# Patient Record
Sex: Female | Born: 1990 | Race: Black or African American | Hispanic: No | Marital: Single | Smoking: Never smoker
Health system: Southern US, Community
[De-identification: ages and names within clinical notes are randomized; demographics above are authoritative.]

## PROBLEM LIST (undated history)

## (undated) DIAGNOSIS — T7840XA Allergy, unspecified, initial encounter: Secondary | ICD-10-CM

## (undated) DIAGNOSIS — A539 Syphilis, unspecified: Secondary | ICD-10-CM

## (undated) DIAGNOSIS — F419 Anxiety disorder, unspecified: Secondary | ICD-10-CM

## (undated) HISTORY — DX: Allergy, unspecified, initial encounter: T78.40XA

## (undated) HISTORY — DX: Syphilis, unspecified: A53.9

## (undated) HISTORY — DX: Anxiety disorder, unspecified: F41.9

---

## 2014-09-22 ENCOUNTER — Emergency Department (HOSPITAL_COMMUNITY)

## 2014-09-22 ENCOUNTER — Emergency Department (HOSPITAL_COMMUNITY)
Admission: EM | Admit: 2014-09-22 | Discharge: 2014-09-22 | Disposition: A | Discharge status: Home / Self Care | Attending: Emergency Medicine | Admitting: Emergency Medicine

## 2014-09-22 ENCOUNTER — Encounter (HOSPITAL_COMMUNITY): Payer: Self-pay | Admitting: Emergency Medicine

## 2014-09-22 DIAGNOSIS — S59902A Unspecified injury of left elbow, initial encounter: Secondary | ICD-10-CM | POA: Insufficient documentation

## 2014-09-22 DIAGNOSIS — W1839XA Other fall on same level, initial encounter: Secondary | ICD-10-CM | POA: Insufficient documentation

## 2014-09-22 DIAGNOSIS — Y9323 Activity, snow (alpine) (downhill) skiing, snow boarding, sledding, tobogganing and snow tubing: Secondary | ICD-10-CM | POA: Insufficient documentation

## 2014-09-22 DIAGNOSIS — Z720 Tobacco use: Secondary | ICD-10-CM | POA: Diagnosis not present

## 2014-09-22 DIAGNOSIS — Z3202 Encounter for pregnancy test, result negative: Secondary | ICD-10-CM | POA: Insufficient documentation

## 2014-09-22 DIAGNOSIS — Y998 Other external cause status: Secondary | ICD-10-CM | POA: Insufficient documentation

## 2014-09-22 DIAGNOSIS — M7702 Medial epicondylitis, left elbow: Secondary | ICD-10-CM | POA: Diagnosis not present

## 2014-09-22 DIAGNOSIS — Y929 Unspecified place or not applicable: Secondary | ICD-10-CM | POA: Insufficient documentation

## 2014-09-22 DIAGNOSIS — M25529 Pain in unspecified elbow: Secondary | ICD-10-CM

## 2014-09-22 LAB — POC URINE PREG, ED: Preg Test, Ur: NEGATIVE

## 2014-09-22 MED ORDER — IBUPROFEN 800 MG PO TABS: 800.0000 mg | ORAL_TABLET | Freq: Three times a day (TID) | ORAL | Status: DC

## 2014-09-22 MED ORDER — IBUPROFEN 400 MG PO TABS
800.0000 mg | ORAL_TABLET | Freq: Once | ORAL | Status: AC
  Administered 2014-09-22: 800 mg via ORAL
  Filled 2014-09-22: qty 2

## 2014-09-22 NOTE — ED Provider Notes (Signed)
CSN: [id number]     Arrival date & time 09/22/14  1831 History  This chart was scribed for Ladona Mow, PA-C working with Jerelyn Scott, MD by Evon Slack, ED Scribe. This patient was seen in room TR09C/TR09C and the patient's care was started at 6:47 PM.    Chief Complaint  Patient presents with  . Arm Pain   Patient is a 24 y.o. female presenting with arm pain. The history is provided by the patient. No language interpreter was used.  Arm Pain   HPI Comments: Kathryn Coleman is a 24 y.o. female who presents to the Emergency Department complaining of left arm pain onset 1 week prior. Pt states she noticed some associated swelling. Pt states that the pain was located mostly around her elbow. Pt states she fell while skiing last weekend and fell onto her left side. Pt states that she noticed slight pain before going skiing and the fall exacerbated her pain. Pt states she has tried tylenol with no relief. Pt denies numbness, weakness, tingling, loss of sensation or function.   History reviewed. No pertinent past medical history. History reviewed. No pertinent past surgical history. History reviewed. No pertinent family history. History  Substance Use Topics  . Smoking status: Current Every Day Smoker  . Smokeless tobacco: Not on file  . Alcohol Use: No   OB History    No data available      Review of Systems  Musculoskeletal: Positive for joint swelling and arthralgias.  Neurological: Negative for numbness.    Allergies  Review of patient's allergies indicates no known allergies.  Home Medications   Prior to Admission medications   Medication Sig Start Date End Date Taking? Authorizing Provider  ibuprofen (ADVIL,MOTRIN) 800 MG tablet Take 1 tablet (800 mg total) by mouth 3 (three) times daily. 09/22/14   Ladona Mow, PA-C   BP 108/67 mmHg  Pulse 69  Temp(Src) 98.6 F (37 C) (Oral)  Resp 16  Wt 117 lb (53.071 kg)  SpO2 100%  LMP 08/31/2014   Physical Exam   Constitutional: She is oriented to person, place, and time. She appears well-developed and well-nourished. No distress.  HENT:  Head: Normocephalic and atraumatic.  Eyes: Conjunctivae and EOM are normal.  Neck: Neck supple. No tracheal deviation present.  Cardiovascular: Normal rate.   Pulmonary/Chest: Effort normal. No respiratory distress.  Musculoskeletal: Normal range of motion. She exhibits tenderness.  Tenderness to palpation medial epicondyle of left elbow, 5/5 motor strength in shoulder, elbow and wrist. Radial pulses 2+ distal sensation intact, cap refill less than 2 sec.  Neurological: She is alert and oriented to person, place, and time.  Skin: Skin is warm and dry.  Psychiatric: She has a normal mood and affect. Her behavior is normal.  Nursing note and vitals reviewed.   ED Course  Procedures (including critical care time) DIAGNOSTIC STUDIES: Oxygen Saturation is 99% on RA, normal by my interpretation.    COORDINATION OF CARE: 6:57 PM-Discussed treatment plan with pt at bedside and pt agreed to plan.     Labs Review Labs Reviewed  POC URINE PREG, ED    Imaging Review Dg Elbow Complete Left  09/22/2014   CLINICAL DATA:  Left arm pain for 1 week, with soft tissue swelling. Pain started about the left elbow. Recent fall. Initial encounter.  EXAM: LEFT ELBOW - COMPLETE 3+ VIEW  COMPARISON:  None.  FINDINGS: There is no evidence of fracture or dislocation. The visualized joint spaces are preserved. No significant  joint effusion is identified. The soft tissues are unremarkable in appearance.  IMPRESSION: No evidence of fracture or dislocation.   Electronically Signed   By: Roanna RaiderJeffery  Chang M.D.   On: 09/22/2014 19:40   Dg Humerus Left  09/22/2014   CLINICAL DATA:  Left arm pain for 1 week, with soft tissue swelling. Recent fall. Initial encounter.  EXAM: LEFT HUMERUS - 2+ VIEW  COMPARISON:  None.  FINDINGS: There is no evidence of fracture or dislocation. The left humerus  appears intact. The left humeral head remains seated at the glenoid fossa. The left acromioclavicular joint is unremarkable in appearance. The elbow joint is grossly unremarkable in appearance. No significant soft tissue abnormalities are characterized on radiograph.  IMPRESSION: No evidence of fracture or dislocation.   Electronically Signed   By: Roanna RaiderJeffery  Chang M.D.   On: 09/22/2014 19:41     EKG Interpretation None      MDM   Final diagnoses:  Elbow pain  Medial epicondylitis of elbow, left   Patient with gradual onset of medial pain to her left elbow. Signs and symptoms most consistent with a medial epicondylitis. Patient reporting worsening of pain after a fall off skiing, no evidence of fracture on radiographs. Patient neurovascularly intact. Recommended conservative therapies, RICE therapy, and follow-up with orthopedics. I discussed return precautions patient, and patient verbalizes understanding and agreement this plan. I encouraged patient to call or return to the ER should she have any questions or concerns.  I personally performed the services described in this documentation, which was scribed in my presence. The recorded information has been reviewed and is accurate.  BP 108/67 mmHg  Pulse 69  Temp(Src) 98.6 F (37 C) (Oral)  Resp 16  Wt 117 lb (53.071 kg)  SpO2 100%  LMP 08/31/2014  Signed,  Ladona MowJoe Cloyd Ragas, PA-C 2:31 AM      Ladona MowJoe Dalene Robards, PA-C 09/23/14 0231  Jerelyn ScottMartha Linker, MD 09/23/14 [phone]

## 2014-09-22 NOTE — ED Notes (Signed)
Declined W/C at D/C and was escorted to lobby by RN. 

## 2014-09-22 NOTE — Discharge Instructions (Signed)
Medial Epicondylitis (Golfer's Elbow) with Rehab Medial epicondylitis involves inflammation and pain around the inner (medial) portion of the elbow. This pain is caused by inflammation of the tendons in the forearm that flex (bring down) the wrist. Medial epicondylitis is also called golfer's elbow, because it is common among golfers. However, it may occur in any individual who flexes the wrist regularly. If medial epicondylitis is left untreated, it may become a chronic problem. SYMPTOMS   Pain, tenderness, or inflammation over the inner (medial) side of the elbow.  Pain or weakness with gripping activities.  Pain that increases with wrist twisting motions (using a screwdriver, playing golf, bowling). CAUSES  Medial epicondylitis is caused by inflammation of the tendons that flex the wrist. Causes of injury may include:  Chronic, repetitive stress and strain to the tendons that run from the wrist and forearm to the elbow.  Sudden strain on the forearm, including wrist snap when serving balls with racquet sports, or throwing a baseball. RISK INCREASES WITH:  Sports or occupations that require repetitive and/or strenuous forearm and wrist movements (pitching a baseball, golfing, carpentry).  Poor wrist and forearm strength and flexibility.  Failure to warm up properly before activity.  Resuming activity before healing, rehabilitation, and conditioning are complete. PREVENTION   Warm up and stretch properly before activity.  Maintain physical fitness:  Strength, flexibility, and endurance.  Cardiovascular fitness.  Wear and use properly fitted equipment.  Learn and use proper technique and have a coach correct improper technique.  Wear a tennis elbow (counterforce) brace. PROGNOSIS  The course of this condition depends on the degree of the injury. If treated properly, acute cases (symptoms lasting less than 4 weeks) are often resolved in 2 to 6 weeks. Chronic (longer lasting  cases) often resolve in 3 to 6 months, but may require physical therapy. RELATED COMPLICATIONS   Frequently recurring symptoms, resulting in a chronic problem. Properly treating the problem the first time decreases frequency of recurrence.  Chronic inflammation, scarring, and partial tendon tear, requiring surgery.  Delayed healing or resolution of symptoms. TREATMENT  Treatment first involves the use of ice and medicine, to reduce pain and inflammation. Strengthening and stretching exercises may reduce discomfort, if performed regularly. These exercises may be performed at home, if the condition is an acute injury. Chronic cases may require a referral to a physical therapist for evaluation and treatment. Your caregiver may advise a corticosteroid injection to help reduce inflammation. Rarely, surgery is needed. MEDICATION  If pain medicine is needed, nonsteroidal anti-inflammatory medicines (aspirin and ibuprofen), or other minor pain relievers (acetaminophen), are often advised.  Do not take pain medicine for 7 days before surgery.  Prescription pain relievers may be given, if your caregiver thinks they are needed. Use only as directed and only as much as you need.  Corticosteroid injections may be recommended. These injections should be reserved only for the most severe cases, because they can only be given a certain number of times. HEAT AND COLD  Cold treatment (icing) should be applied for 10 to 15 minutes every 2 to 3 hours for inflammation and pain, and immediately after activity that aggravates your symptoms. Use ice packs or an ice massage.  Heat treatment may be used before performing stretching and strengthening activities prescribed by your caregiver, physical therapist, or athletic trainer. Use a heat pack or a warm water soak. SEEK MEDICAL CARE IF: Symptoms get worse or do not improve in 2 weeks, despite treatment. EXERCISES  RANGE OF MOTION (  ROM) AND STRETCHING EXERCISES -  Epicondylitis, Medial (Golfer's Elbow) These exercises may help you when beginning to rehabilitate your injury. Your symptoms may go away with or without further involvement from your physician, physical therapist or athletic trainer. While completing these exercises, remember:   Restoring tissue flexibility helps normal motion to return to the joints. This allows healthier, less painful movement and activity.  An effective stretch should be held for at least 30 seconds.  A stretch should never be painful. You should only feel a gentle lengthening or release in the stretched tissue. RANGE OF MOTION - Wrist Flexion, Active-Assisted  Extend your right / left elbow with your fingers pointing down.*  Gently pull the back of your hand towards you, until you feel a gentle stretch on the top of your forearm.  Hold this position for __________ seconds. Repeat __________ times. Complete this exercise __________ times per day.  *If directed by your physician, physical therapist or athletic trainer, complete this stretch with your elbow bent, rather than extended. RANGE OF MOTION - Wrist Extension, Active-Assisted  Extend your right / left elbow and turn your palm upwards.*  Gently pull your palm and fingertips back, so your wrist extends and your fingers point more toward the ground.  You should feel a gentle stretch on the inside of your forearm.  Hold this position for __________ seconds. Repeat __________ times. Complete this exercise __________ times per day. *If directed by your physician, physical therapist or athletic trainer, complete this stretch with your elbow bent, rather than extended. STRETCH - Wrist Extension   Place your right / left fingertips on a tabletop leaving your elbow slightly bent. Your fingers should point backwards.  Gently press your fingers and palm down onto the table, by straightening your elbow. You should feel a stretch on the inside of your forearm.  Hold  this position for __________ seconds. Repeat __________ times. Complete this stretch __________ times per day.  STRENGTHENING EXERCISES - Epicondylitis, Medial (Golfer's Elbow) These exercises may help you when beginning to rehabilitate your injury. They may resolve your symptoms with or without further involvement from your physician, physical therapist or athletic trainer. While completing these exercises, remember:   Muscles can gain both the endurance and the strength needed for everyday activities through controlled exercises.  Complete these exercises as instructed by your physician, physical therapist or athletic trainer. Increase the resistance and repetitions only as guided.  You may experience muscle soreness or fatigue, but the pain or discomfort you are trying to eliminate should never worsen during these exercises. If this pain does get worse, stop and make sure you are following the directions exactly. If the pain is still present after adjustments, discontinue the exercise until you can discuss the trouble with your caregiver. STRENGTH - Wrist Flexors  Sit with your right / left forearm palm-up, and fully supported on a table or countertop. Your elbow should be resting below the height of your shoulder. Allow your wrist to extend over the edge of the surface.  Loosely holding a __________ weight, or a piece of rubber exercise band or tubing, slowly curl your hand up toward your forearm.  Hold this position for __________ seconds. Slowly lower the wrist back to the starting position in a controlled manner. Repeat __________ times. Complete this exercise __________ times per day.  STRENGTH - Wrist Extensors  Sit with your right / left forearm palm-down and fully supported. Your elbow should be resting below the height of your shoulder.  Allow your wrist to extend over the edge of the surface.  Loosely holding a __________ weight, or a piece of rubber exercise band or tubing, slowly  curl your hand up toward your forearm.  Hold this position for __________ seconds. Slowly lower the wrist back to the starting position in a controlled manner. Repeat __________ times. Complete this exercise __________ times per day.  STRENGTH - Ulnar Deviators  Stand with a ____________________ weight in your right / left hand, or sit while holding a rubber exercise band or tubing, with your healthy arm supported on a table or countertop.  Move your wrist so that your pinkie travels toward your forearm and your thumb moves away from your forearm.  Hold this position for __________ seconds and then slowly lower the wrist back to the starting position. Repeat __________ times. Complete this exercise __________ times per day STRENGTH - Grip   Grasp a tennis ball, a dense sponge, or a large, rolled sock in your hand.  Squeeze as hard as you can, without increasing any pain.  Hold this position for __________ seconds. Release your grip slowly. Repeat __________ times. Complete this exercise __________ times per day.  STRENGTH - Forearm Supinators   Sit with your right / left forearm supported on a table, keeping your elbow below shoulder height. Rest your hand over the edge, palm down.  Gently grip a hammer or a soup ladle.  Without moving your elbow, slowly turn your palm and hand upward to a "thumbs-up" position.  Hold this position for __________ seconds. Slowly return to the starting position. Repeat __________ times. Complete this exercise __________ times per day.  STRENGTH - Forearm Pronators  Sit with your right / left forearm supported on a table, keeping your elbow below shoulder height. Rest your hand over the edge, palm up.  Gently grip a hammer or a soup ladle.  Without moving your elbow, slowly turn your palm and hand upward to a "thumbs-up" position.  Hold this position for __________ seconds. Slowly return to the starting position. Repeat __________ times. Complete  this exercise __________ times per day.  Document Released: 06/26/2005 Document Revised: 09/18/2011 Document Reviewed: 10/08/2008 [facility] Patient Information 2015 [place], [place]. This information is not intended to replace advice given to you by your health care provider. Make sure you discuss any questions you have with your health care provider.   Emergency Department Resource Guide 1) Find a Doctor and Pay Out of Pocket Although you won't have to find out who is covered by your insurance plan, it is a good idea to ask around and get recommendations. You will then need to call the office and see if the doctor you have chosen will accept you as a new patient and what types of options they offer for patients who are self-pay. Some doctors offer discounts or will set up payment plans for their patients who do not have insurance, but you will need to ask so you aren't surprised when you get to your appointment.  2) Contact Your Local Health Department Not all health departments have doctors that can see patients for sick visits, but many do, so it is worth a call to see if yours does. If you don't know where your local health department is, you can check in your phone book. The CDC also has a tool to help you locate your state's health department, and many state websites also have listings of all of their local health departments.  3) Find a Walk-in  Clinic If your illness is not likely to be very severe or complicated, you may want to try a walk in clinic. These are popping up all over the country in pharmacies, drugstores, and shopping centers. They're usually staffed by nurse practitioners or physician assistants that have been trained to treat common illnesses and complaints. They're usually fairly quick and inexpensive. However, if you have serious medical issues or chronic medical problems, these are probably not your best option.  No Primary Care Doctor: - Call Health Connect at  [phone] -  they can help you locate a primary care doctor that  accepts your insurance, provides certain services, etc. - Physician Referral Service- [phone]  Chronic Pain Problems: Organization         Address  Phone   Notes  [facility]  [phone] Patients need to be referred by their primary care doctor.   Medication Assistance: Organization         Address  Phone   Notes  [facility] Medication [facility] [address] [place] [place], [place] [address] [phone] --Must be a resident of [facility] -- Must have NO insurance coverage whatsoever (no Medicaid/ Medicare, etc.) -- The pt. MUST have a primary care doctor that directs their care regularly and follows them in the community   [facility]  [phone]   [employer]  [phone]    Agencies that provide inexpensive medical care: Organization         Address  Phone   Notes  [facility]  [phone]   [facility]    [phone]   [facility] [address] [place], [place] [address] [phone]   [facility] [address] [place] [place] [phone]   Planned Parenthood    [phone]   [facility]    [phone]   [facility]  [address], [place] Phone:  [phone], Fax:  [phone] Hours of Operation:  9 am - 6 pm, M-F.  Also accepts Medicaid/Medicare and self-pay.  [facility]  [address], [place] Phone: [phone], Fax: [phone]. Hours of Operation:  8:30 am - 5:30 pm, M-F.  Also accepts Medicaid and self-pay.  [facility] [place] [address] [place] Phone: [phone]   [facility] [address] Natasha Bence [place], [place] [phone], Ext. [phone] Mondays & Thursdays: 7-9 AM.  First 15 patients are seen on a first come, first serve basis.    Medicaid-accepting  [facility] Providers:  Organization         Address  Phone   Notes  [facility] [address], [place] [phone] Also accepts self-pay patients.  [facility] [address] Laurell Josephs [place], [place]  [phone]   [facility] [address], [place] [phone]   [facility] [address], [place] [phone]   Renaye Rakers [address], [place]   [phone] Only accepts [place] Access [place] patients after they have their name applied to their card.   Self-Pay (no insurance) in [facility]:  Organization         Address  Phone   Notes  Sickle Cell Patients, Engineer, technical sales Internal Medicine [address] [place], [place] (  [phone]) [phone]   [facility] [address], [place] [phone]   [facility]  [address], [place] [phone]   [facility]/Dr. Osei-Bonsu  [address] [place] [address], [place] or [phone], [place] [phone] Phone number for both [place] and [place] locations is the same.  [facility] [address], [place] [phone]   [facility] [place] [address] [place] [address], [place] or [address] [phone]   [facility] [address], [place] [phone]) [phone], phone; [phone](336) [phone], fax Sees patients 1st and 3rd Saturday of every month.  Must not qualify for public or private insurance (i.e. Medicaid, Medicare, [place] Health Choice, Veterans' Benefits)  Household income should be no more than 200% of the poverty level The clinic cannot treat you if you are pregnant or think you are pregnant  Sexually transmitted diseases are not treated at the clinic.    Dental Care: Organization         Address  Phone  Notes  [facility] [address], [place] [phone] Accepts children up to age 521 who are enrolled in [place] or [place] Health Choice; pregnant women with a Medicaid card; and children who have applied for Medicaid or [place] Health Choice, but were declined, whose parents can pay a reduced fee at time of service.  [facility] [place]  [address], [place] [phone]) [phone] Accepts children up to age 23 who are enrolled in [place] or [place] Health Choice; pregnant women with a Medicaid card; and children who have applied for Medicaid or [place] Health Choice, but were declined, whose parents can pay a reduced fee at time of service.  [facility]  [address], [place] [phone]) [phone] Patients are seen by appointment only. Walk-ins are not accepted. [facility] will see patients 23 years of age and older. Monday - Tuesday (8am-5pm) Most Wednesdays (8:30-5pm) $30 per visit, cash only  [facility]  [address], [facility] [place] [phone]) [phone] Patients are seen by appointment only. Walk-ins are not accepted. [facility] will see patients 23 years of age and older. One Wednesday Evening (Monthly: Volunteer Based).  $30 per visit, cash only  [employer] [facility] [facility]  [phone] for adults; Children under age 814, call Graduate Pediatric Dentistry at [phone]. Children aged 464-14, please call [phone] to request a pediatric application.  Dental services are provided in all areas of dental care including fillings, crowns and bridges, complete and partial dentures, implants, gum treatment, root canals, and extractions. Preventive care is also provided. Treatment is provided to both adults and children. Patients are selected via a lottery and there is often a waiting list.   [facility] [address], [place]  [phone] [email or web address]   [facility] [address], [place], [place] [phone], Ext. [phone] Second and Fourth Thursday of each month, opens at 6:30 AM; Clinic ends at 9 AM.  Patients are seen on a first-come first-served basis, and a limited number are seen during each clinic.   [facility]  [address], [place], [place] [phone]   Eligibility Requirements You must have lived in [place], [place], or [place] counties  for at least the last three months.   You cannot be eligible for state or federal sponsored [place], including [employer], [place], or [employer].   You generally cannot be eligible for healthcare insurance through your employer.    How to apply: Eligibility screenings are held every Tuesday and Wednesday afternoon from 1:00 pm until 4:00 pm. You do not need an appointment for the interview!  [employer] [address] [place], [place] [phone]   [facility] Health Department  [phone]   [facility] Health Department  [phone]   [facility] Health Department  [phone]    Behavioral Health Resources in the Community: Intensive Outpatient Programs Organization         Address  Phone  Notes  [facility] [address], [place], [place] [phone]   [facility] Outpatient [address], [place], [place] [phone]   [facility] [address] [place], [place]  [phone]   [facility] Mental Health [address]  [place], [place] [phone] or [phone]   Substance Abuse Resources Organization         Address  Phone  Notes  [facility] Drug [facility]  [phone]   [facility]  [phone]   [facility]  [phone]   Floydene Flock  [phone]   Residential & Outpatient Substance Abuse Program  [phone]   Psychological Services Organization         Address  Phone  Notes  [facility]   [phone]   [facility] Services  [phone]   [facility] [address] [place] [phone] or [phone]    Mobile Crisis Teams Organization         Address  Phone  Notes  [facility], Mobile Crisis Care Unit  [phone]   [facility]  [address] [place], [place] [phone]   Doristine Locks [address] [place] [phone]    Self-Help/Support Groups Organization         Address  Phone             Notes  [facility] [place] - variety of support groups  [phone]- I7437963 Call for more information  Narcotics Anonymous (NA), [facility] [address], [employer]  2 meetings at this location   Statistician         Address  Phone  Notes  [facility] [address],    [place]  [phone]   [facility]  [address], [place] [phone], [place], [place] [phone]   [facility] [address] [place], [place] [phone] Admissions: 8am-3pm M-F  [facility] [address]    [place], [place] [phone]   [facility] [address] Starling Manns [place], [place] [phone]   [facility] [address],  [place], [place] [phone]   [facility] - Intensive Outpatient [address] Laure[address] Josephs [address], [place], [place] [phone]   [facility] ([facility] [address] [place]  [place], [place] [phone] or [phone]   [facility] ([facility]) [address], [place], [place] [phone] Accepts Medicaid  [facility] [place] [address],  [place] [phone] Substance Abuse/Addiction Treatment   [facility] Resources Organization         Address  Phone  Notes  [facility]  [phone]  [phone]   Angie Fava, PhD [address] Ervin Knack [place], [place]   [phone] or  [phone]   [facility] Behavioral   [address] [place], [place] [phone]   [facility] [address], [place], [place] [phone] Insurance/Medicaid/sponsorship through [facility] and Families [address]                                    [place], [place] [phone] Therapy/tele-psych/case  [facility] [address], [place] [phone]    Dr. Lolly Mustache  [phone]   [facility]  [employer] [facility] Dept. 1) [address], [place] 2) [address], [place] 3)  [address], [place] [phone]  [phone]   [facility] Child Abuse Hotline 9133624582 or [phone] (After Hours)

## 2014-09-22 NOTE — ED Notes (Signed)
Pt c/o left arm pain from elbow to shoulder worse after skiing last weekend

## 2015-08-06 IMAGING — CR DG ELBOW COMPLETE 3+V*L*
4 series · 4 of 4 positions shown · non-contrast
Comparison: None.

CLINICAL DATA: Left arm pain for 1 week, with soft tissue swelling.
Pain started about the left elbow. Recent fall. Initial encounter.

EXAM:
LEFT ELBOW - COMPLETE 3+ VIEW

[elbow ap]
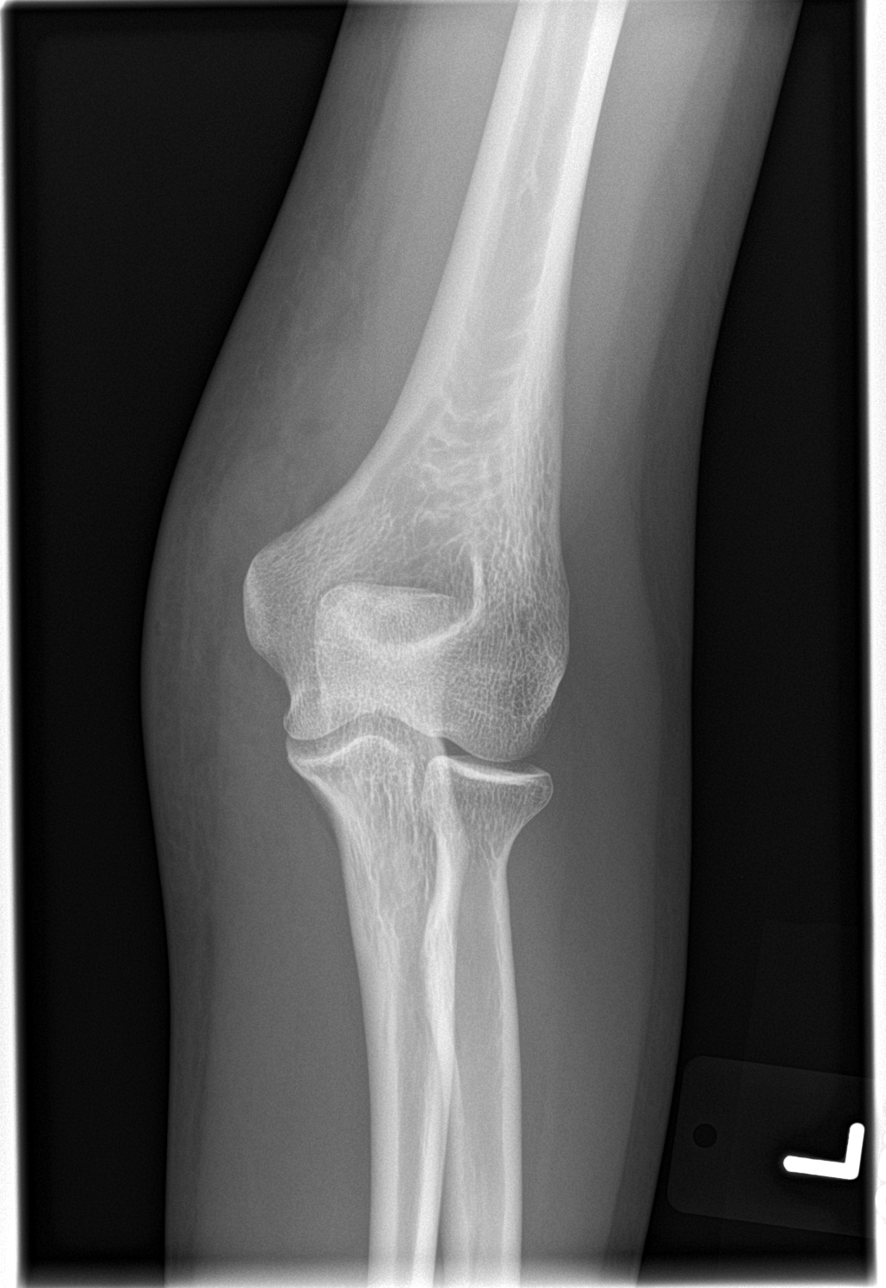

[elbow obl (1 of 2)]
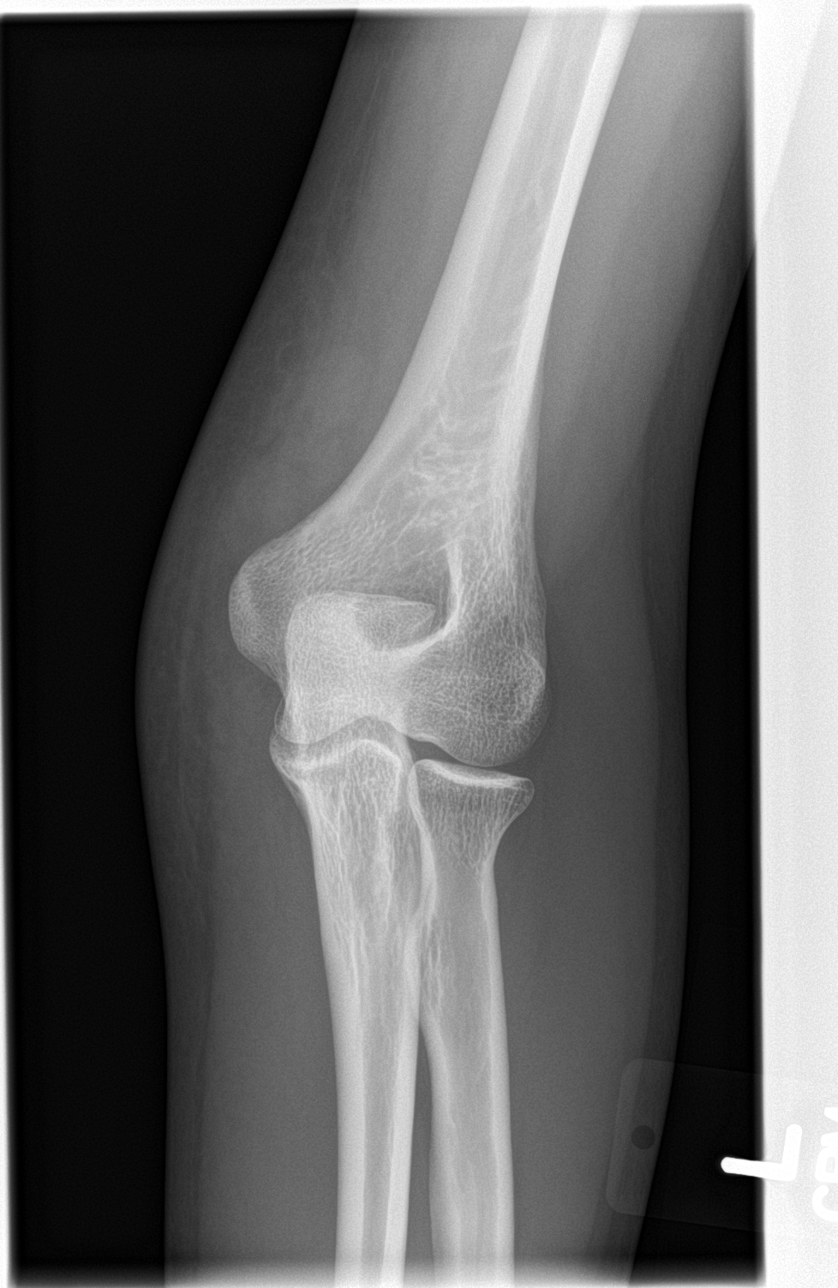

[elbow obl (2 of 2)]
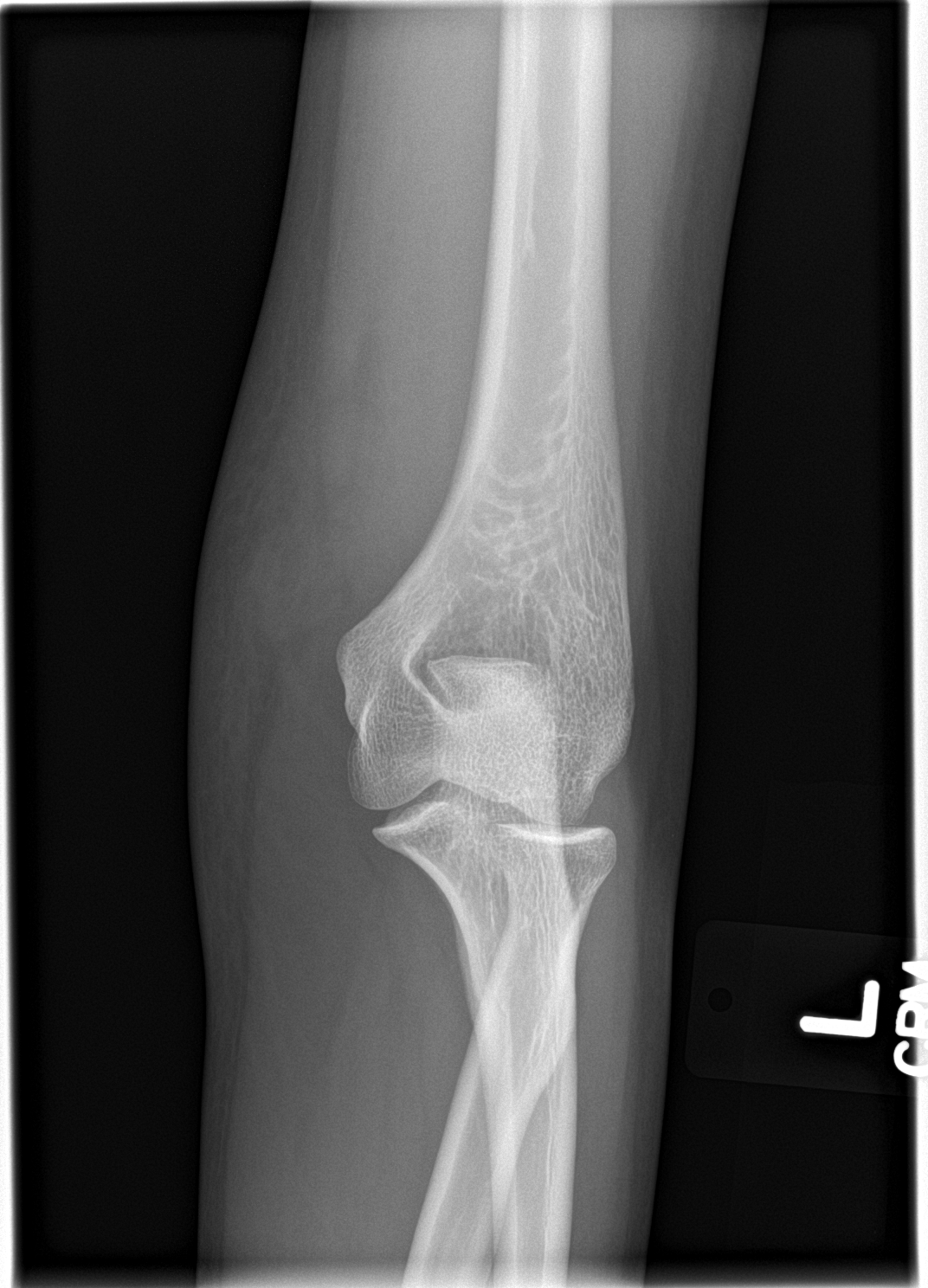

[elbow lat]
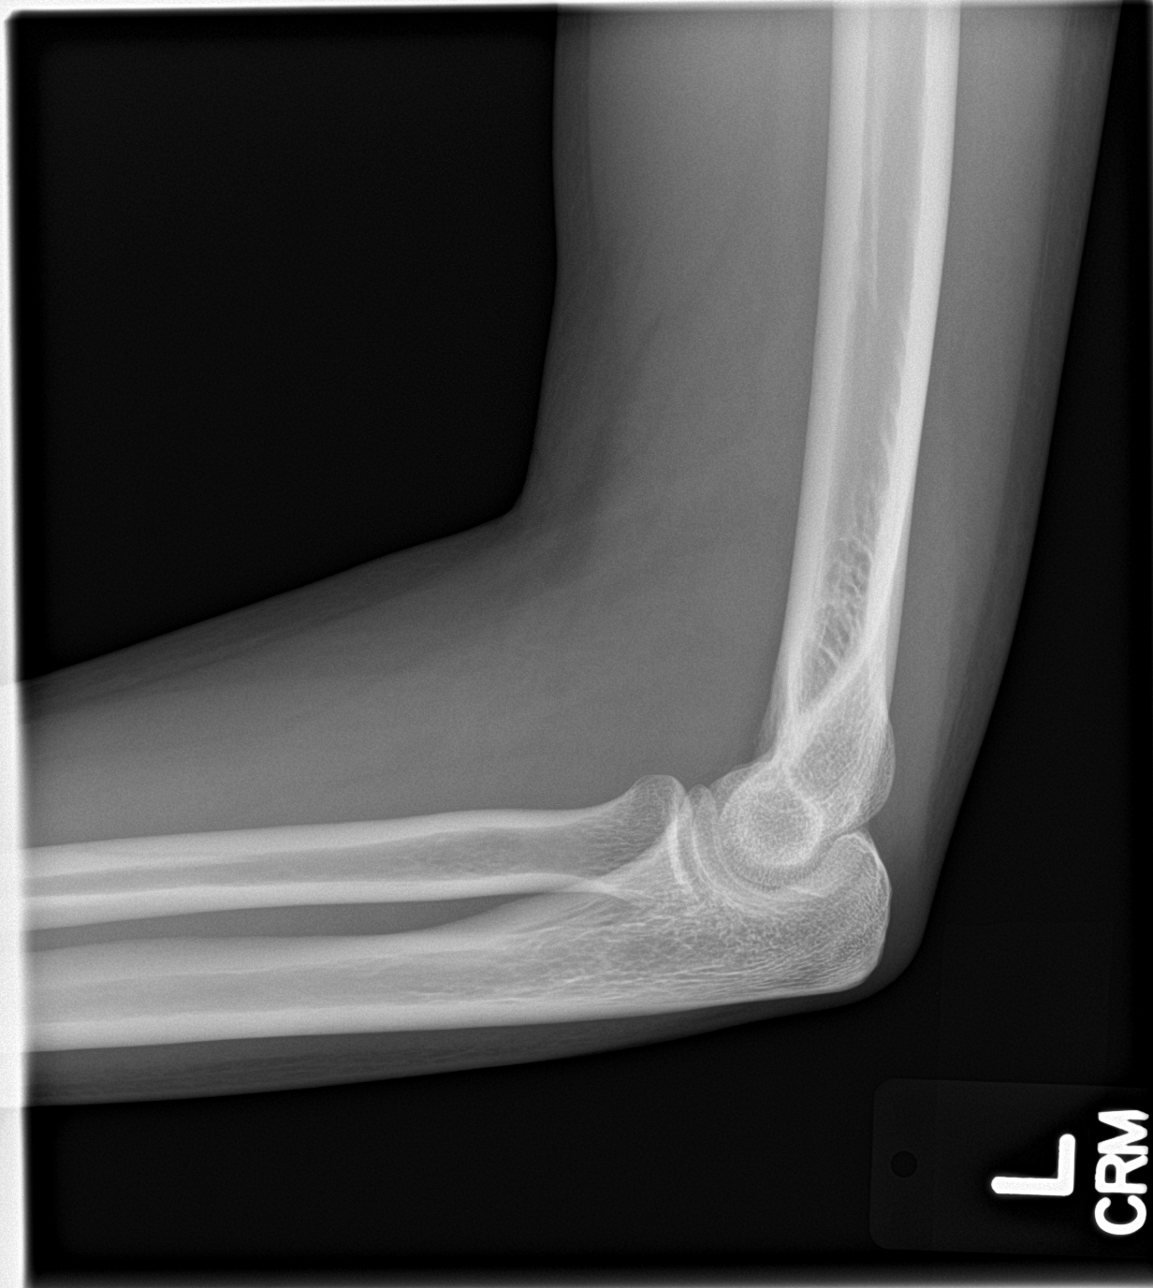

[4 of 4 positions shown; findings below may reference images not displayed]

FINDINGS: There is no evidence of fracture or dislocation. The visualized
joint spaces are preserved. No significant joint effusion is
identified. The soft tissues are unremarkable in appearance.
IMPRESSION: No evidence of fracture or dislocation.

## 2016-10-10 ENCOUNTER — Encounter: Payer: Self-pay | Admitting: Family Medicine

## 2016-10-10 ENCOUNTER — Ambulatory Visit (INDEPENDENT_AMBULATORY_CARE_PROVIDER_SITE_OTHER): Payer: PRIVATE HEALTH INSURANCE | Admitting: Family Medicine

## 2016-10-10 VITALS — BP 106/80 | HR 88 | Resp 12 | Ht 67.0 in | Wt 123.2 lb

## 2016-10-10 DIAGNOSIS — R5383 Other fatigue: Secondary | ICD-10-CM | POA: Diagnosis not present

## 2016-10-10 DIAGNOSIS — L659 Nonscarring hair loss, unspecified: Secondary | ICD-10-CM | POA: Diagnosis not present

## 2016-10-10 DIAGNOSIS — R21 Rash and other nonspecific skin eruption: Secondary | ICD-10-CM | POA: Diagnosis not present

## 2016-10-10 DIAGNOSIS — Z0000 Encounter for general adult medical examination without abnormal findings: Secondary | ICD-10-CM | POA: Diagnosis not present

## 2016-10-10 NOTE — Patient Instructions (Signed)
A few things to remember from today's visit:   Rash and other nonspecific skin eruption - Plan: Ambulatory referral to Dermatology, CBC with Differential/Platelet, Basic metabolic panel, TSH, C-reactive protein, Sedimentation rate, ANA,IFA RA Diag Pnl w/rflx Tit/Patn  Alopecia of scalp - Plan: Ambulatory referral to Dermatology, CBC with Differential/Platelet, Basic metabolic panel, TSH, C-reactive protein, Sedimentation rate, ANA,IFA RA Diag Pnl w/rflx Tit/Patn  Please schedule gyn exam with gyn or with [place].   We have ordered labs or studies at this visit.  It can take up to 1-2 weeks for results and processing. IF results require follow up or explanation, we will call you with instructions. Clinically stable results will be released to your [facility]. If you have not heard from [place] or cannot find your results in [facility] in 2 weeks please contact our office at [phone].  If you are not yet signed up for [facility], please consider signing up  Please be sure medication list is accurate. If a new problem present, please set up appointment sooner than planned today.

## 2016-10-10 NOTE — Progress Notes (Signed)
HPI:   Kathryn Coleman is a 26 y.o. female, who is here today to establish care.  Former PCP: N/A Last preventive routine visit: Many years ago.  She is otherwise healthy, does not take chronic medications.   Concerns today: Rash  For about a year she has had mildly pruritic rash that started on left shoulder then affected right one and 2 months ago started with scalp lesions and associated hair loss. Usually lesions starts like a dry area,scaly,and pruritic. She denies causing skin wounds from scratching. Denies insect bites or animal bites/scratch.  She has not identified exacerbating or alleviating factors. She has not used OTC medications.  "Really tired" for the past 1-2 months.  Hx of anxiety,she does not take medication. Denies depression or suicidal thoughts.  Hx of syphilis. According to pt, she was diagnosed during a screening testing at age 66, she didn't have any symptom. She is reporting appropriate treatment at the time of the Dx.  + Arthralgias on PIP joints and occasionally shoulders. No limitation of movement, edema or erythema. No facial rash.   Review of Systems  Constitutional: Positive for fatigue. Negative for appetite change, chills, fever and unexpected weight change.  HENT: Negative for congestion, mouth sores, sore throat, trouble swallowing and voice change.   Eyes: Negative for redness and visual disturbance.  Respiratory: Negative for cough, shortness of breath and wheezing.   Cardiovascular: Negative for chest pain, palpitations and leg swelling.  Gastrointestinal: Negative for abdominal pain, nausea and vomiting.       No changes in bowel habits.  Endocrine: Negative for cold intolerance, heat intolerance, polydipsia, polyphagia and polyuria.  Genitourinary: Negative for decreased urine volume and hematuria.  Musculoskeletal: Positive for arthralgias. Negative for back pain, gait problem, joint swelling and myalgias.  Skin: Positive  for rash.  Allergic/Immunologic: Positive for environmental allergies.  Neurological: Negative for syncope, weakness, numbness and headaches.  Hematological: Negative for adenopathy. Does not bruise/bleed easily.  Psychiatric/Behavioral: Negative for confusion and sleep disturbance. The patient is nervous/anxious.     No current outpatient prescriptions on file prior to visit.   No current facility-administered medications on file prior to visit.      Past Medical History:  Diagnosis Date  . Allergy   . Anxiety   . Syphilis    No Known Allergies  Family History  Problem Relation Age of Onset  . Cancer Maternal Aunt     cervix  . Cancer Maternal Uncle     leukemia  . Heart disease Maternal Grandfather   . Cancer Paternal Grandmother     breast    Social History   Social History  . Marital status: Single    Spouse name: N/A  . Number of children: N/A  . Years of education: N/A   Social History Main Topics  . Smoking status: Never Smoker  . Smokeless tobacco: Never Used  . Alcohol use Yes  . Drug use: No  . Sexual activity: Not Asked   Other Topics Concern  . None   Social History Narrative  . None    Vitals:   10/10/16 1501  BP: 106/80  Pulse: 88  Resp: 12  O2 sat at RA 97%  Body mass index is 19.3 kg/m.  Physical Exam  Nursing note and vitals reviewed. Constitutional: She is oriented to person, place, and time. She appears well-developed and well-nourished. No distress.  HENT:  Head: Atraumatic.  Mouth/Throat: Oropharynx is clear and moist and mucous  membranes are normal.  Eyes: Conjunctivae and EOM are normal. Pupils are equal, round, and reactive to light.  Neck: No thyroid mass and no thyromegaly present.  Respiratory: Effort normal and breath sounds normal. No respiratory distress.  Musculoskeletal: Normal range of motion. She exhibits no edema.  No significant deformities or signs of synovitis appreciated.  Lymphadenopathy:       Head  (right side): No occipital adenopathy present.       Head (left side): No occipital adenopathy present.  Palpable lymph nodes: posterior cervical and supraclavicular bilateral, 1 cm in average,no tender.  Neurological: She is alert and oriented to person, place, and time.  Skin: Skin is warm. Rash noted. No ecchymosis noted. Rash is not vesicular.     Right occipital (3-4 cm) and mid fronto-parietal (4-5 cm)areas with alopecia and scalp scarring changes. Lateral aspect of shoulders scars with hyper and hypopigmentation, post inflammatory changes.No active lesion or erythema,no tender.  Psychiatric: Her affect is labile. She expresses no suicidal ideation.  Well groomed, good eye contact.      ASSESSMENT AND PLAN:   Kathryn Coleman was seen today for establish care.  Diagnoses and all orders for this visit:  Rash and other nonspecific skin eruption  We discussed possible etiologies: Eczema,discoid lupus,etc. Dermatology referral will be arranged. Further recommendations will be given according to lab results.   -     Ambulatory referral to Dermatology -     CBC with Differential/Platelet -     Basic metabolic panel -     TSH -     C-reactive protein -     Sedimentation rate -     ANA,IFA RA Diag Pnl w/rflx Tit/Patn  Alopecia of scalp  We discussed different types of alopecia and treatment options. ? Lupus. It does not seem alopecia areata.  -     Ambulatory referral to Dermatology -     CBC with Differential/Platelet -     Basic metabolic panel -     TSH -     C-reactive protein -     Sedimentation rate -     ANA,IFA RA Diag Pnl w/rflx Tit/Patn  Fatigue, unspecified type  More recent symptoms,which could be associated to a systemic illness.  No Hx of anemia. She has Hx of anxiety,she does not feel like it is getting worse.  Further recommendations will be given according to lab results.    -     CBC with Differential/Platelet -     Basic metabolic panel -      TSH  Healthcare maintenance  Educated about preventive guidelines for her age, she needs to have a pap smear.      Betty G. [place], MD  [facility]. [place] office.

## 2016-10-10 NOTE — Progress Notes (Signed)
Pre visit review using our clinic review tool, if applicable. No additional management support is needed unless otherwise documented below in the visit note. 

## 2016-10-11 LAB — CBC WITH DIFFERENTIAL/PLATELET
BASOS ABS: 0 10*3/uL (ref 0.0–0.1)
Basophils Relative: 1 % (ref 0.0–3.0)
EOS ABS: 0 10*3/uL (ref 0.0–0.7)
Eosinophils Relative: 0.8 % (ref 0.0–5.0)
HEMATOCRIT: 40.9 % (ref 36.0–46.0)
HEMOGLOBIN: 13.6 g/dL (ref 12.0–15.0)
Lymphocytes Relative: 33.3 % (ref 12.0–46.0)
Lymphs Abs: 1.4 10*3/uL (ref 0.7–4.0)
MCHC: 33.3 g/dL (ref 30.0–36.0)
MCV: 88 fl (ref 78.0–100.0)
Monocytes Absolute: 0.3 10*3/uL (ref 0.1–1.0)
Monocytes Relative: 6.9 % (ref 3.0–12.0)
Neutro Abs: 2.5 10*3/uL (ref 1.4–7.7)
Neutrophils Relative %: 58 % (ref 43.0–77.0)
Platelets: 205 10*3/uL (ref 150.0–400.0)
RBC: 4.65 Mil/uL (ref 3.87–5.11)
RDW: 13.1 % (ref 11.5–15.5)
WBC: 4.3 10*3/uL (ref 4.0–10.5)

## 2016-10-11 LAB — TSH: TSH: 0.69 u[IU]/mL (ref 0.35–4.50)

## 2016-10-11 LAB — ANA,IFA RA DIAG PNL W/RFLX TIT/PATN
Anti Nuclear Antibody(ANA): POSITIVE — AB
Cyclic Citrullin Peptide Ab: <16 Units
Rhuematoid fact SerPl-aCnc: <14 IU/mL (ref <14)

## 2016-10-11 LAB — BASIC METABOLIC PANEL
BUN: 10 mg/dL (ref 6–23)
CHLORIDE: 102 meq/L (ref 96–112)
CO2: 27 mEq/L (ref 19–32)
Calcium: 9.6 mg/dL (ref 8.4–10.5)
Creatinine, Ser: 0.72 mg/dL (ref 0.40–1.20)
GFR: 127.26 mL/min (ref >60.00)
GLUCOSE: 84 mg/dL (ref 70–99)
Potassium: 3.8 mEq/L (ref 3.5–5.1)
Sodium: 138 mEq/L (ref 135–145)

## 2016-10-11 LAB — C-REACTIVE PROTEIN: CRP: 0.1 mg/dL — ABNORMAL LOW (ref 0.5–20.0)

## 2016-10-11 LAB — ANTI-NUCLEAR AB-TITER (ANA TITER)

## 2016-10-11 LAB — SEDIMENTATION RATE: Sed Rate: 21 mm/hr — ABNORMAL HIGH (ref 0–20)

## 2016-10-12 ENCOUNTER — Other Ambulatory Visit: Payer: Self-pay

## 2016-10-12 MED ORDER — CLOBETASOL PROPIONATE 0.05 % EX CREA: TOPICAL_CREAM | CUTANEOUS | 0 refills | Status: AC
